# Patient Record
Sex: Female | Born: 1940 | Race: White | Hispanic: No | Smoking: Former smoker
Health system: Southern US, Community
[De-identification: ages and names within clinical notes are randomized; demographics above are authoritative.]

## PROBLEM LIST (undated history)

## (undated) DIAGNOSIS — M545 Low back pain, unspecified: Secondary | ICD-10-CM

## (undated) DIAGNOSIS — E039 Hypothyroidism, unspecified: Secondary | ICD-10-CM

## (undated) DIAGNOSIS — M21379 Foot drop, unspecified foot: Secondary | ICD-10-CM

## (undated) DIAGNOSIS — E785 Hyperlipidemia, unspecified: Secondary | ICD-10-CM

## (undated) DIAGNOSIS — R32 Unspecified urinary incontinence: Secondary | ICD-10-CM

## (undated) DIAGNOSIS — G600 Hereditary motor and sensory neuropathy: Secondary | ICD-10-CM

## (undated) DIAGNOSIS — F329 Major depressive disorder, single episode, unspecified: Secondary | ICD-10-CM

## (undated) DIAGNOSIS — F32A Depression, unspecified: Secondary | ICD-10-CM

## (undated) DIAGNOSIS — K219 Gastro-esophageal reflux disease without esophagitis: Secondary | ICD-10-CM

## (undated) DIAGNOSIS — S92909A Unspecified fracture of unspecified foot, initial encounter for closed fracture: Secondary | ICD-10-CM

## (undated) HISTORY — DX: Major depressive disorder, single episode, unspecified: F32.9

## (undated) HISTORY — DX: Low back pain: M54.5

## (undated) HISTORY — DX: Depression, unspecified: F32.A

## (undated) HISTORY — DX: Low back pain, unspecified: M54.50

## (undated) HISTORY — DX: Unspecified fracture of unspecified foot, initial encounter for closed fracture: S92.909A

## (undated) HISTORY — PX: TONSILLECTOMY: SUR1361

## (undated) HISTORY — DX: Foot drop, unspecified foot: M21.379

## (undated) HISTORY — DX: Hyperlipidemia, unspecified: E78.5

## (undated) HISTORY — PX: TUBAL LIGATION: SHX77

## (undated) HISTORY — DX: Gastro-esophageal reflux disease without esophagitis: K21.9

## (undated) HISTORY — DX: Unspecified urinary incontinence: R32

## (undated) HISTORY — DX: Hypothyroidism, unspecified: E03.9

## (undated) HISTORY — DX: Hereditary motor and sensory neuropathy: G60.0

---

## 2009-03-26 ENCOUNTER — Encounter: Admission: RE | Admit: 2009-03-26 | Discharge: 2009-04-30 | Payer: Self-pay | Admitting: Neurology

## 2013-01-15 ENCOUNTER — Encounter: Payer: Self-pay | Admitting: Neurology

## 2013-01-15 ENCOUNTER — Ambulatory Visit (INDEPENDENT_AMBULATORY_CARE_PROVIDER_SITE_OTHER): Payer: Medicare Other | Admitting: Neurology

## 2013-01-15 ENCOUNTER — Encounter (INDEPENDENT_AMBULATORY_CARE_PROVIDER_SITE_OTHER): Payer: Self-pay

## 2013-01-15 VITALS — BP 120/83 | HR 82 | Wt 192.0 lb

## 2013-01-15 DIAGNOSIS — R269 Unspecified abnormalities of gait and mobility: Secondary | ICD-10-CM | POA: Insufficient documentation

## 2013-01-15 DIAGNOSIS — G600 Hereditary motor and sensory neuropathy: Secondary | ICD-10-CM | POA: Insufficient documentation

## 2013-01-15 MED ORDER — TIZANIDINE HCL 2 MG PO TABS: 2.0000 mg | ORAL_TABLET | Freq: Two times a day (BID) | ORAL | Status: DC

## 2013-01-15 MED ORDER — GABAPENTIN 300 MG PO CAPS: ORAL_CAPSULE | ORAL | Status: DC

## 2013-01-15 NOTE — Patient Instructions (Signed)

## 2013-01-15 NOTE — Progress Notes (Signed)
Reason for visit: Charcot-Marie-Tooth disease  Heather Novak is an 72 y.o. female  History of present illness:  Heather Novak is a 72 year old right-handed white female with a history of Charcot-Marie-Tooth disease with bilateral foot drops. The patient has gotten AFO braces bilaterally, and it has helped her ability to ambulate safely significantly. The patient has not had any falls since last seen. The patient indicates that there has been some issue with cramping of the thighs and the abdomen at times. The patient in the past has been on diazepam in low dose, and this has been helpful. The patient is taking gabapentin without complete control of the neuromuscular discomfort. The patient denies any significant problems with discomfort in the feet, numbness in the feet. The patient returns to the office today for an evaluation.  Past Medical History  Diagnosis Date  . Footdrop     left  . Urinary incontinence   . Low back pain   . Dyslipidemia   . Hypothyroidism   . Depression   . Charcot-Marie-Tooth disease   . GERD (gastroesophageal reflux disease)   . Foot fracture     right    Past Surgical History  Procedure Laterality Date  . Tubal ligation      bilateral  . Tonsillectomy      Family History  Problem Relation Age of Onset  . Stroke Father   . Tremor Father     Social history:  reports that she has quit smoking. Her smoking use included Cigarettes. She smoked 0.00 packs per day. She has never used smokeless tobacco. She reports that she drinks alcohol. She reports that she does not use illicit drugs.   No Known Allergies  Medications:  No current outpatient prescriptions on file prior to visit.   No current facility-administered medications on file prior to visit.    ROS:  Out of a complete 14 system review of symptoms, the patient complains only of the following symptoms, and all other reviewed systems are negative.  Weight gain Swelling in the legs Itching,  moles Urinary frequency and incontinence Feeling cold Muscle cramps, achy muscles Runny nose Decreased energy Restless legs, sleepiness  Blood pressure 120/83, pulse 82, weight 192 lb (87.091 kg).  Physical Exam  General: The patient is alert and cooperative at the time of the examination.  Skin: No significant peripheral edema is noted. The patient is wearing AFO braces bilaterally.   Neurologic Exam  Mental status: The patient is oriented x 3.  Cranial nerves: Facial symmetry is present. Speech is normal, no aphasia or dysarthria is noted. Extraocular movements are full. Visual fields are full.  Motor: The patient has good strength in all 4 extremities, with exception that the patient has bilateral foot drops, and there may be some slight intrinsic muscle weakness of the hands bilaterally.  Sensory examination: Soft touch sensation is symmetric on the face, arms, and legs.  Coordination: The patient has good finger-nose-finger and heel-to-shin bilaterally.  Gait and station: The patient has a slightly wide-based gait. Tandem gait is slightly unsteady.. Romberg is negative. No drift is seen.  Reflexes: Deep tendon reflexes are symmetric, but are depressed at throughout.   Assessment/Plan:  1. Charcot-Marie-Tooth disease  2. Gait instability  The patient indicates that the AFO braces are slightly loose, and she is to go to the orthotics center to have them refitted. The patient is otherwise doing well at this point. The patient will followup in 6-8 months. The patient will be placed  on tizanidine for the muscle cramps. If this is not effective, diazepam can be used.  Heather Palau MD 01/15/2013 8:32 PM  [facility] [address] [place], [place] [address]  Phone [phone] Fax [phone]

## 2013-01-30 ENCOUNTER — Telehealth: Payer: Self-pay | Admitting: Neurology

## 2013-01-30 MED ORDER — DIAZEPAM 2 MG PO TABS: 2.0000 mg | ORAL_TABLET | Freq: Two times a day (BID) | ORAL | Status: DC | PRN

## 2013-01-30 NOTE — Telephone Encounter (Signed)
I called patient. The patient could not tolerate the tizanidine. The patient has has done well with diazepam with the muscle cramps. I will place her on 2 mg twice daily if needed. The patient is not to take the alprazolam if she is on the diazepam.

## 2013-01-31 ENCOUNTER — Telehealth: Payer: Self-pay

## 2013-01-31 NOTE — Telephone Encounter (Signed)
Rx faxed

## 2013-07-18 ENCOUNTER — Ambulatory Visit: Payer: Medicaid Other | Admitting: Neurology

## 2013-07-18 ENCOUNTER — Ambulatory Visit: Payer: Self-pay | Admitting: Adult Health

## 2013-07-22 ENCOUNTER — Ambulatory Visit (INDEPENDENT_AMBULATORY_CARE_PROVIDER_SITE_OTHER): Payer: Medicare Other | Admitting: Adult Health

## 2013-07-22 ENCOUNTER — Encounter: Payer: Self-pay | Admitting: Adult Health

## 2013-07-22 ENCOUNTER — Encounter (INDEPENDENT_AMBULATORY_CARE_PROVIDER_SITE_OTHER): Payer: Self-pay

## 2013-07-22 VITALS — BP 125/83 | HR 58 | Ht 64.5 in | Wt 194.0 lb

## 2013-07-22 DIAGNOSIS — G600 Hereditary motor and sensory neuropathy: Secondary | ICD-10-CM

## 2013-07-22 DIAGNOSIS — R269 Unspecified abnormalities of gait and mobility: Secondary | ICD-10-CM

## 2013-07-22 MED ORDER — DIAZEPAM 2 MG PO TABS: 2.0000 mg | ORAL_TABLET | Freq: Two times a day (BID) | ORAL | Status: DC | PRN

## 2013-07-22 NOTE — Progress Notes (Signed)
I have read the note, and I agree with the clinical assessment and plan.  [place] Bryngelson K Penina Reisner   

## 2013-07-22 NOTE — Patient Instructions (Signed)
Charcot-Marie-Tooth Disorder Charcot-Marie-Tooth (CMT) Disorder is a group of inherited diseases which affect the nerves to the arms and legs. The problems can range from very mild to severe weakness. The feet and legs tend to be affected first. High foot arches and curled toes are often the first signs of this disorder. Because the muscles are not getting the right signals from the brain, walking may become difficult. There may be numbness as well. Fingers and hands may also be involved. Over time, the feet and hands may be deformed.  TREATMENT  There is no cure or specific treatment for CMT. Care may include custom-made shoes and leg braces to reduce discomfort and increase function. Physical therapy and moderate activity are often used to maintain muscle strength. For some patients, surgery may help correct deformities. Pain medicines may be needed. PROGNOSIS CMT is not a fatal disease and most forms of the disorder do not affect normal life expectancy. Most individuals with CMT are able to work. Wheelchair confinement is rare. Document Released: 01/20/2002 Document Revised: 04/24/2011 Document Reviewed: 01/28/2008 [facility] Patient Information 2014 [place]. Fall Prevention and Home Safety Falls cause injuries and can affect all age groups. It is possible to use preventive measures to significantly decrease the likelihood of falls. There are many simple measures which can make your home safer and prevent falls. OUTDOORS  Repair cracks and edges of walkways and driveways.  Remove high doorway thresholds.  Trim shrubbery on the main path into your home.  Have good outside lighting.  Clear walkways of tools, rocks, debris, and clutter.  Check that handrails are not broken and are securely fastened. Both sides of steps should have handrails.  Have leaves, snow, and ice cleared regularly.  Use sand or salt on walkways during winter months.  In the garage, clean up grease or oil  spills. BATHROOM  Install night lights.  Install grab bars by the toilet and in the tub and shower.  Use non-skid mats or decals in the tub or shower.  Place a plastic non-slip stool in the shower to sit on, if needed.  Keep floors dry and clean up all water on the floor immediately.  Remove soap buildup in the tub or shower on a regular basis.  Secure bath mats with non-slip, double-sided rug tape.  Remove throw rugs and tripping hazards from the floors. BEDROOMS  Install night lights.  Make sure a bedside light is easy to reach.  Do not use oversized bedding.  Keep a telephone by your bedside.  Have a firm chair with side arms to use for getting dressed.  Remove throw rugs and tripping hazards from the floor. KITCHEN  Keep handles on pots and pans turned toward the center of the stove. Use back burners when possible.  Clean up spills quickly and allow time for drying.  Avoid walking on wet floors.  Avoid hot utensils and knives.  Position shelves so they are not too high or low.  Place commonly used objects within easy reach.  If necessary, use a sturdy step stool with a grab bar when reaching.  Keep electrical cables out of the way.  Do not use floor polish or wax that makes floors slippery. If you must use wax, use non-skid floor wax.  Remove throw rugs and tripping hazards from the floor. STAIRWAYS  Never leave objects on stairs.  Place handrails on both sides of stairways and use them. Fix any loose handrails. Make sure handrails on both sides of the stairways  are as long as the stairs.  Check carpeting to make sure it is firmly attached along stairs. Make repairs to worn or loose carpet promptly.  Avoid placing throw rugs at the top or bottom of stairways, or properly secure the rug with carpet tape to prevent slippage. Get rid of throw rugs, if possible.  Have an electrician put in a light switch at the top and bottom of the stairs. OTHER FALL  PREVENTION TIPS  Wear low-heel or rubber-soled shoes that are supportive and fit well. Wear closed toe shoes.  When using a stepladder, make sure it is fully opened and both spreaders are firmly locked. Do not climb a closed stepladder.  Add color or contrast paint or tape to grab bars and handrails in your home. Place contrasting color strips on first and last steps.  Learn and use mobility aids as needed. Install an electrical emergency response system.  Turn on lights to avoid dark areas. Replace light bulbs that burn out immediately. Get light switches that glow.  Arrange furniture to create clear pathways. Keep furniture in the same place.  Firmly attach carpet with non-skid or double-sided tape.  Eliminate uneven floor surfaces.  Select a carpet pattern that does not visually hide the edge of steps.  Be aware of all pets. OTHER HOME SAFETY TIPS  Set the water temperature for 120 F (48.8 C).  Keep emergency numbers on or near the telephone.  Keep smoke detectors on every level of the home and near sleeping areas. Document Released: 01/20/2002 Document Revised: 08/01/2011 Document Reviewed: 04/21/2011 [facility] Patient Information 2014 [place].

## 2013-07-22 NOTE — Progress Notes (Signed)
PATIENT: Heather Novak DOB: 1940-07-14  REASON FOR VISIT: follow up HISTORY FROM: patient  HISTORY OF PRESENT ILLNESS: Heather Novak is a 73 year old right-handed white female with a history of Charcot-Marie-Tooth disease with bilateral foot drops. She returns today for evaluation. She currently wears AFO braces and reports that has helped with ambulation. Patient states that she fell two weeks ago and hit her nose. She states that at that time she did not have her braces on. The patient does have some neuromuscular discomfort and she is takes gabapentin for that. Reports that the gabapentin does offer some relief. Patient feels that lately she has been down in the dumps she went back on her bupropion several weeks ago. She also takes valium when she has muscle cramps, at times she will have to take two in order to get rid of her muscle cramps. She is also taking klonopin for anxiety. No new medical issues since last visit.  REVIEW OF SYSTEMS: Full 14 system review of systems performed and notable only for:  Constitutional: fatigue Eyes: eye redness, light sensitivity Ear/Nose/Throat: runny nose  Skin: N/A  Cardiovascular: leg swelling Respiratory: N/A  Gastrointestinal: N/A  Genitourinary: N/A Hematology/Lymphatic: N/A  Endocrine: N/A Musculoskeletal: aching muscles, muscles cramps, walking difficulty Allergy/Immunology: env allergies Neurological: memory loss sleep: restless leg, frequent waking, daytime sleepiness Psychiatric: depression, nervous/anxious   ALLERGIES: Allergies  Allergen Reactions  . Tizanidine     Headache, dizziness    HOME MEDICATIONS: Outpatient Prescriptions Prior to Visit  Medication Sig Dispense Refill  . diazepam (VALIUM) 2 MG tablet Take 1 tablet (2 mg total) by mouth every 12 (twelve) hours as needed for anxiety.  60 tablet  3  . gabapentin (NEURONTIN) 300 MG capsule One capsule twice during the day, two capsules at night      . levothyroxine  (SYNTHROID, LEVOTHROID) 100 MCG tablet Take 100 mcg by mouth daily before breakfast.      . VESICARE 10 MG tablet Take 10 mg by mouth daily.      Marland Kitchen ALPRAZolam (XANAX) 0.5 MG tablet Take 0.5 mg by mouth daily.       No facility-administered medications prior to visit.    PAST MEDICAL HISTORY: Past Medical History  Diagnosis Date  . Footdrop     left  . Urinary incontinence   . Low back pain   . Dyslipidemia   . Hypothyroidism   . Depression   . Charcot-Marie-Tooth disease   . GERD (gastroesophageal reflux disease)   . Foot fracture     right    PAST SURGICAL HISTORY: Past Surgical History  Procedure Laterality Date  . Tubal ligation      bilateral  . Tonsillectomy      FAMILY HISTORY: Family History  Problem Relation Age of Onset  . Stroke Father   . Tremor Father     SOCIAL HISTORY: History   Social History  . Marital Status: Unknown    Spouse Name: N/A    Number of Children: 4  . Years of Education: 11th   Occupational History  . retired    Social History Main Topics  . Smoking status: Former Smoker    Types: Cigarettes  . Smokeless tobacco: Never Used     Comment: quit 1989  . Alcohol Use: Yes     Comment: occasional 3 drinks per week  . Drug Use: No  . Sexual Activity: Not on file   Other Topics Concern  . Not on file  Social History Narrative   Single, with 4 children   11th grade   Right handed   1 cup daily      PHYSICAL EXAM  Filed Vitals:   07/22/13 1308  BP: 125/83  Pulse: 58  Height: 5' 4.5" (1.638 m)  Weight: 194 lb (87.998 kg)   Body mass index is 32.8 kg/(m^2). Generalized: Well developed, in no acute distress   Neurological examination  Mentation: Alert oriented to time, place, history taking. Follows all commands speech and language fluent Cranial nerve II-XII:Extraocular movements were full, visual field were full on confrontational test.  Motor: The motor testing reveals 5 over 5 strength of all 4 extremities  except that she has bilateral foot drops and intrinsic muscle weakness in the hands. Good symmetric motor tone is noted throughout.  Sensory: Sensory testing is intact to soft touch on all 4 extremities. No evidence of extinction is noted.  Coordination: Cerebellar testing reveals good finger-nose-finger and heel-to-shin bilaterally.  Gait and station: Gait is normal. Tandem gait is normal. Romberg is negative. No drift is seen.  Reflexes: Deep tendon reflexes absent.   DIAGNOSTIC DATA (LABS, IMAGING, TESTING) - I reviewed patient records, labs, notes, testing and imaging myself where available.   ASSESSMENT AND PLAN 73 y.o. year old female  has a past medical history of Footdrop; Urinary incontinence; Low back pain; Dyslipidemia; Hypothyroidism; Depression; Charcot-Marie-Tooth disease; GERD (gastroesophageal reflux disease); and Foot fracture. here with:  1. Charcot-Marie-Tooth disease 2. Abnormality of gait  Patient has remained stable. She did a fall two weeks ago but she was not wearing her braces. She would like to go back to physical therapy to get them to look at her AFO braces to see if there is a way to make them more comfortable, however she states insurance will not approve it till July. I advised the patient to call back then and I will refer her to physical therapy. Patient states that the valium is working well especially for muscle spasms, I will refill today. The patient has not been taking her gabapentin at night. If she wants to resume that is ok, she can start by taking 1 tablet at night and increase to 2 tablets if needed. The patient should follow up in 6 months or sooner if needed.   Ward Givens, MSN, NP-C 07/22/2013, 1:14 PM [facility] [address] [place], [place] [address] [phone]  Note: This document was prepared with digital dictation and possible smart phrase technology. Any transcriptional errors that result from this process  are unintentional.

## 2013-12-05 ENCOUNTER — Other Ambulatory Visit: Payer: Self-pay | Admitting: Neurology

## 2013-12-09 ENCOUNTER — Other Ambulatory Visit: Payer: Self-pay

## 2013-12-09 MED ORDER — DIAZEPAM 2 MG PO TABS: 2.0000 mg | ORAL_TABLET | Freq: Two times a day (BID) | ORAL | Status: DC | PRN

## 2013-12-09 NOTE — Telephone Encounter (Signed)
Rx signed and faxed.

## 2013-12-31 ENCOUNTER — Encounter: Payer: Self-pay | Admitting: Neurology

## 2014-01-06 ENCOUNTER — Encounter: Payer: Self-pay | Admitting: Neurology

## 2014-01-22 ENCOUNTER — Ambulatory Visit: Payer: Medicare Other | Admitting: Adult Health

## 2014-01-27 ENCOUNTER — Ambulatory Visit: Payer: Medicare Other | Admitting: Adult Health

## 2014-04-06 ENCOUNTER — Encounter: Payer: Self-pay | Admitting: Adult Health

## 2014-04-06 ENCOUNTER — Ambulatory Visit (INDEPENDENT_AMBULATORY_CARE_PROVIDER_SITE_OTHER): Payer: Medicare Other | Admitting: Adult Health

## 2014-04-06 VITALS — BP 112/78 | HR 79 | Ht 64.0 in | Wt 176.8 lb

## 2014-04-06 DIAGNOSIS — R269 Unspecified abnormalities of gait and mobility: Secondary | ICD-10-CM | POA: Diagnosis not present

## 2014-04-06 DIAGNOSIS — M21372 Foot drop, left foot: Secondary | ICD-10-CM

## 2014-04-06 DIAGNOSIS — M21371 Foot drop, right foot: Secondary | ICD-10-CM | POA: Diagnosis not present

## 2014-04-06 DIAGNOSIS — G600 Hereditary motor and sensory neuropathy: Secondary | ICD-10-CM | POA: Diagnosis not present

## 2014-04-06 MED ORDER — DIAZEPAM 2 MG PO TABS: 2.0000 mg | ORAL_TABLET | Freq: Two times a day (BID) | ORAL | Status: DC | PRN

## 2014-04-06 NOTE — Patient Instructions (Signed)
  Valium was refilled.  Use braces  Wear shoes in the home to prevent falls Use cane at night when you get up to go to the bathroom.

## 2014-04-06 NOTE — Progress Notes (Signed)
PATIENT: Heather Novak DOB: January 15, 1941  REASON FOR VISIT: follow up- CMT HISTORY FROM: patient  HISTORY OF PRESENT ILLNESS: Heather Novak is a 74 year old right-handed white female with a history of Charcot-Marie-Tooth disease with bilateral foot drops. She returns today for evaluation. She is currently taking gabapentin for neuromuscular discomfort. She reports this is working well. Patient will also have muscle cramps and she uses valium- this continues to work well. Patient wears AFO braces. She is getting new tennis shoes to use. She has had about 10 falls since the last visit. They occur mainly at night when she gets up to go to the bathroom and she does not have her braces on.  Patient states that she was has developed a "shakiness" after starting Effexor but that has improved. She state that she has hand weakness and when picking up something heavy it will cause her hand to shake. She also states that she messed her bladder medication up and took 2 pills instead of 1 for 9 days this also made her shaky. Overall no new medical issues.   HISTORY 07/22/13: Heather Novak is a 74 year old right-handed white female with a history of Charcot-Marie-Tooth disease with bilateral foot drops. She returns today for evaluation. She currently wears AFO braces and reports that has helped with ambulation. Patient states that she fell two weeks ago and hit her nose. She states that at that time she did not have her braces on. The patient does have some neuromuscular discomfort and she is takes gabapentin for that. Reports that the gabapentin does offer some relief. Patient feels that lately she has been down in the dumps she went back on her bupropion several weeks ago. She also takes valium when she has muscle cramps, at times she will have to take two in order to get rid of her muscle cramps. She is also taking klonopin for anxiety. No new medical issues since last visit.  REVIEW OF SYSTEMS: Out of a complete 14 system  review of symptoms, the patient complains only of the following symptoms, and all other reviewed systems are negative.  Runny nose, trouble swallowing, restless leg, frequently taking, daytime sleepiness, acting out dreams, muscle cramps, memory loss, tremors, nervous/anxious  ALLERGIES: Allergies  Allergen Reactions  . Tizanidine     Headache, dizziness    HOME MEDICATIONS: Outpatient Prescriptions Prior to Visit  Medication Sig Dispense Refill  . BUPROPION HCL PO Take by mouth.    . ClonazePAM (KLONOPIN PO) Take by mouth.    . diazepam (VALIUM) 2 MG tablet Take 1 tablet (2 mg total) by mouth every 12 (twelve) hours as needed for anxiety. 60 tablet 5  . Esomeprazole Magnesium (NEXIUM PO) Take by mouth.    . gabapentin (NEURONTIN) 300 MG capsule One capsule twice during the day, two capsules at night    . levothyroxine (SYNTHROID, LEVOTHROID) 100 MCG tablet Take 100 mcg by mouth daily before breakfast.    . VESICARE 10 MG tablet Take 10 mg by mouth daily.     No facility-administered medications prior to visit.    PAST MEDICAL HISTORY: Past Medical History  Diagnosis Date  . Footdrop     left  . Urinary incontinence   . Low back pain   . Dyslipidemia   . Hypothyroidism   . Depression   . Charcot-Marie-Tooth disease   . GERD (gastroesophageal reflux disease)   . Foot fracture     right    PAST SURGICAL HISTORY: Past Surgical History  Procedure Laterality Date  . Tubal ligation      bilateral  . Tonsillectomy      FAMILY HISTORY: Family History  Problem Relation Age of Onset  . Stroke Father   . Tremor Father          PHYSICAL EXAM  Filed Vitals:   04/06/14 1107  BP: 112/78  Pulse: 79  Height: 5\' 4"  (1.626 m)  Weight: 176 lb 12.8 oz (80.196 kg)   Body mass index is 30.33 kg/(m^2). Generalized: Well developed, in no acute distress   Neurological examination  Mentation: Alert oriented to time, place, history taking. Follows all commands speech and  language fluent Cranial nerve II-XII: Pupils were equal round reactive to light. Extraocular movements were full, visual field were full on confrontational test. Facial sensation and strength were normal. Uvula tongue midline. Head turning and shoulder shrug  were normal and symmetric. Motor: The motor testing reveals 5 over 5 strength of all 4 extremities. Atrophy of hand muscles bilaterally. Good symmetric motor tone is noted throughout.  Sensory: Sensory testing is intact to soft touch on all 4 extremities. No evidence of extinction is noted.  Coordination: Cerebellar testing reveals good finger-nose-finger and heel-to-shin bilaterally.  Gait and station: Patient does not have her braces on today. She has a steppage type gait. Tandem gait not attempted. Romberg is negative. No drift is seen.  Reflexes: Deep tendon reflexes are symmetric and normal bilaterally.    DIAGNOSTIC DATA (LABS, IMAGING, TESTING) - I reviewed patient records, labs, notes, testing and imaging myself where available.    ASSESSMENT AND PLAN 74 y.o. year old female  has a past medical history of Footdrop; Urinary incontinence; Low back pain; Dyslipidemia; Hypothyroidism; Depression; Charcot-Marie-Tooth disease; GERD (gastroesophageal reflux disease); and Foot fracture. here with:  1. Charcot Marie Tooth disease 2. Bilateral foot drop  Overall the patient is doing well. She has had several falls since the last visit. However the patient was not using her braces when she fell. Encouraged patient to use a cane or walker when ambulating during the night to the bathroom. She verbalized understanding. I also recommended that she always has on shoes/braces when ambulating to prevent falls. If the patient continues to have frequent falls I will refer the patient for physical therapy. She will follow-up in 6 months or sooner if needed.  Ward Givens, MSN, NP-C 04/06/2014, 11:20 AM [facility] [address]  [place], [place] [address] [phone]  Note: This document was prepared with digital dictation and possible smart phrase technology. Any transcriptional errors that result from this process are unintentional.

## 2014-04-06 NOTE — Progress Notes (Signed)
I reviewed note and agree with plan.   Penni Bombard, MD 3/64/6803, 2:12 PM Certified in Neurology, Neurophysiology and Neuroimaging  [facility] [address], [place], [place] [address] [phone]

## 2014-04-06 NOTE — Progress Notes (Signed)
I have read the note, and I agree with the clinical assessment and plan.  Delynda Sepulveda KEITH   

## 2014-10-07 ENCOUNTER — Encounter: Payer: Self-pay | Admitting: Neurology

## 2014-10-07 ENCOUNTER — Ambulatory Visit (INDEPENDENT_AMBULATORY_CARE_PROVIDER_SITE_OTHER): Payer: Medicare Other | Admitting: Neurology

## 2014-10-07 VITALS — BP 112/76 | HR 79 | Ht 64.0 in | Wt 166.0 lb

## 2014-10-07 DIAGNOSIS — G600 Hereditary motor and sensory neuropathy: Secondary | ICD-10-CM | POA: Diagnosis not present

## 2014-10-07 DIAGNOSIS — R269 Unspecified abnormalities of gait and mobility: Secondary | ICD-10-CM

## 2014-10-07 NOTE — Progress Notes (Signed)
Reason for visit:  Charcot-Marie-Tooth disease  Heather Novak is an 74 y.o. female  History of present illness:  Heather Novak is a 74 year old right-handed white female with a history of Charcot-Marie-Tooth disease. The patient has weakness involving the hands and feet, she wears bilateral AFO braces for bilateral foot drops. She has good gait stability using the braces, she denies any falls since last seen. She has some minor discomfort in the feet, primarily associated with hammertoes involving the right foot. The patient does not wish to consider surgery for the feet, however. She denies any significant issues or changes in abilities secondary to her neuropathy since last seen. She returns to this office for an evaluation.  Past Medical History  Diagnosis Date  . Footdrop     left  . Urinary incontinence   . Low back pain   . Dyslipidemia   . Hypothyroidism   . Depression   . Charcot-Marie-Tooth disease   . GERD (gastroesophageal reflux disease)   . Foot fracture     right    Past Surgical History  Procedure Laterality Date  . Tubal ligation      bilateral  . Tonsillectomy      Family History  Problem Relation Age of Onset  . Stroke Father   . Tremor Father     Social history:  reports that she has quit smoking. Her smoking use included Cigarettes. She has never used smokeless tobacco. She reports that she drinks alcohol. She reports that she does not use illicit drugs.    Allergies  Allergen Reactions  . Tizanidine     Headache, dizziness    Medications:  Prior to Admission medications   Medication Sig Start Date End Date Taking? Authorizing Provider  BUPROPION HCL PO Take by mouth.   Yes Historical Provider, MD  ClonazePAM (KLONOPIN PO) Take by mouth.   Yes Historical Provider, MD  diazepam (VALIUM) 2 MG tablet Take 1 tablet (2 mg total) by mouth every 12 (twelve) hours as needed for anxiety. 04/06/14  Yes Ward Givens, NP  Esomeprazole Magnesium (NEXIUM PO)  Take by mouth.   Yes Historical Provider, MD  gabapentin (NEURONTIN) 300 MG capsule One capsule twice during the day, two capsules at night 01/15/13  Yes Kathrynn Ducking, MD  levothyroxine (SYNTHROID, LEVOTHROID) 100 MCG tablet Take 100 mcg by mouth daily before breakfast. 12/17/12  Yes Historical Provider, MD  nystatin cream (MYCOSTATIN)  03/24/14  Yes Historical Provider, MD  TOVIAZ 8 MG TB24 tablet 8 mg daily. 03/23/14  Yes Historical Provider, MD  venlafaxine XR (EFFEXOR-XR) 37.5 MG 24 hr capsule Take 37.5 mg by mouth daily. 04/05/14  Yes Historical Provider, MD  ZETIA 10 MG tablet 10 mg daily. 03/11/14  Yes Historical Provider, MD    ROS:  Out of a complete 14 system review of symptoms, the patient complains only of the following symptoms, and all other reviewed systems are negative.  Runny nose Eye itching, light sensitivity Cold intolerance, heat intolerance Memory loss Muscle cramps Skin rash, itching  Blood pressure 112/76, pulse 79, height 5\' 4"  (1.626 m), weight 166 lb (75.297 kg).  Physical Exam  General: The patient is alert and cooperative at the time of the examination.  Skin: No significant peripheral edema is noted. The patient is wearing bilateral AFO braces.   Neurologic Exam  Mental status: The patient is alert and oriented x 3 at the time of the examination. The patient has apparent normal recent and remote memory, with  an apparently normal attention span and concentration ability.   Cranial nerves: Facial symmetry is present. Speech is normal, no aphasia or dysarthria is noted. Extraocular movements are full. Visual fields are full.  Motor: The patient has good strength in all 4 extremities, with exception of intrinsic muscle weakness bilaterally with the hands, 4/5. The patient also has significant bilateral foot drops.  Sensory examination: Soft touch sensation is symmetric on the face, arms, and legs. There is a stocking pattern pinprick sensory deficit up to  the knees bilaterally.  Coordination: The patient has good finger-nose-finger and heel-to-shin bilaterally.  Gait and station: The patient has a slightly wide-based gait, the patient has remarkable stability with ambulation using the AFO braces. Tandem gait was not tested. Romberg is negative. No drift is seen.  Reflexes: Deep tendon reflexes are symmetric, but are depressed to absent throughout.   Assessment/Plan:  1. Charcot-Marie-Tooth disease  The patient is doing quite well at this time with the AFO braces. She is having minimal discomfort. She does have hammertoes on the right foot, if this is causing significant issues for her, a referral to a podiatrist may be in order. The patient has good stability with ambulation. She will fall on occasion, but it is usually at night when she is trying to go to the bathroom, and she is not wearing her braces. She will follow-up in one year, sooner if needed.  Jill Alexanders MD 10/07/2014 7:13 PM  [facility] [address] [place], [place] [address]  Phone [phone] Fax [phone]

## 2015-02-10 ENCOUNTER — Other Ambulatory Visit: Payer: Self-pay | Admitting: Neurology

## 2015-02-10 MED ORDER — DIAZEPAM 2 MG PO TABS: 2.0000 mg | ORAL_TABLET | Freq: Two times a day (BID) | ORAL | Status: DC | PRN

## 2015-02-10 NOTE — Telephone Encounter (Signed)
Pt needs refill on diazepam (VALIUM) 2 MG tablet. She is also requesting an increase in dosage. Thank you

## 2015-02-10 NOTE — Telephone Encounter (Signed)
I called back and spoke with the patient.  Says she does not need a dose increase at this time.  Verified she is only taking a max of two 2mg  tabs daily if needed.  The Rx in system is for 2mg  tabs one every 12 hours.  Says she will call us back in the future should anything change.   Dr Jannifer Franklin is out of the office.  Request entered, forwarded to [facility] for review.

## 2015-10-11 ENCOUNTER — Ambulatory Visit (INDEPENDENT_AMBULATORY_CARE_PROVIDER_SITE_OTHER): Payer: Medicare Other | Admitting: Neurology

## 2015-10-11 ENCOUNTER — Encounter: Payer: Self-pay | Admitting: Neurology

## 2015-10-11 VITALS — BP 122/84 | HR 68 | Ht 64.0 in | Wt 160.0 lb

## 2015-10-11 DIAGNOSIS — G600 Hereditary motor and sensory neuropathy: Secondary | ICD-10-CM | POA: Diagnosis not present

## 2015-10-11 DIAGNOSIS — R269 Unspecified abnormalities of gait and mobility: Secondary | ICD-10-CM

## 2015-10-11 NOTE — Patient Instructions (Addendum)
Fall Prevention in the Home  Falls can cause injuries and can affect people from all age groups. There are many simple things that you can do to make your home safe and to help prevent falls. WHAT CAN I DO ON THE OUTSIDE OF MY HOME?  Regularly repair the edges of walkways and driveways and fix any cracks.  Remove high doorway thresholds.  Trim any shrubbery on the main path into your home.  Use bright outdoor lighting.  Clear walkways of debris and clutter, including tools and rocks.  Regularly check that handrails are securely fastened and in good repair. Both sides of any steps should have handrails.  Install guardrails along the edges of any raised decks or porches.  Have leaves, snow, and ice cleared regularly.  Use sand or salt on walkways during winter months.  In the garage, clean up any spills right away, including grease or oil spills. WHAT CAN I DO IN THE BATHROOM?  Use night lights.  Install grab bars by the toilet and in the tub and shower. Do not use towel bars as grab bars.  Use non-skid mats or decals on the floor of the tub or shower.  If you need to sit down while you are in the shower, use a plastic, non-slip stool..  Keep the floor dry. Immediately clean up any water that spills on the floor.  Remove soap buildup in the tub or shower on a regular basis.  Attach bath mats securely with double-sided non-slip rug tape.  Remove throw rugs and other tripping hazards from the floor. WHAT CAN I DO IN THE BEDROOM?  Use night lights.  Make sure that a bedside light is easy to reach.  Do not use oversized bedding that drapes onto the floor.  Have a firm chair that has side arms to use for getting dressed.  Remove throw rugs and other tripping hazards from the floor. WHAT CAN I DO IN THE KITCHEN?   Clean up any spills right away.  Avoid walking on wet floors.  Place frequently used items in easy-to-reach places.  If you need to reach for something  above you, use a sturdy step stool that has a grab bar.  Keep electrical cables out of the way.  Do not use floor polish or wax that makes floors slippery. If you have to use wax, make sure that it is non-skid floor wax.  Remove throw rugs and other tripping hazards from the floor. WHAT CAN I DO IN THE STAIRWAYS?  Do not leave any items on the stairs.  Make sure that there are handrails on both sides of the stairs. Fix handrails that are broken or loose. Make sure that handrails are as long as the stairways.  Check any carpeting to make sure that it is firmly attached to the stairs. Fix any carpet that is loose or worn.  Avoid having throw rugs at the top or bottom of stairways, or secure the rugs with carpet tape to prevent them from moving.  Make sure that you have a light switch at the top of the stairs and the bottom of the stairs. If you do not have them, have them installed. WHAT ARE SOME OTHER FALL PREVENTION TIPS?  Wear closed-toe shoes that fit well and support your feet. Wear shoes that have rubber soles or low heels.  When you use a stepladder, make sure that it is completely opened and that the sides are firmly locked. Have someone hold the ladder while you   are using it. Do not climb a closed stepladder.  Add color or contrast paint or tape to grab bars and handrails in your home. Place contrasting color strips on the first and last steps.  Use mobility aids as needed, such as canes, walkers, scooters, and crutches.  Turn on lights if it is dark. Replace any light bulbs that burn out.  Set up furniture so that there are clear paths. Keep the furniture in the same spot.  Fix any uneven floor surfaces.  Choose a carpet design that does not hide the edge of steps of a stairway.  Be aware of any and all pets.  Review your medicines with your healthcare provider. Some medicines can cause dizziness or changes in blood pressure, which increase your risk of falling. Talk  with your health care provider about other ways that you can decrease your risk of falls. This may include working with a physical therapist or trainer to improve your strength, balance, and endurance.   This information is not intended to replace advice given to you by your health care provider. Make sure you discuss any questions you have with your health care provider.   Document Released: 01/20/2002 Document Revised: 06/16/2014 Document Reviewed: 03/06/2014 Elsevier Interactive Patient Education 2016 [employer]  

## 2015-10-11 NOTE — Progress Notes (Signed)
Reason for visit: Charcot-Marie-Tooth disease  Heather Novak is an 75 y.o. female  History of present illness:  Heather Novak is a 75 year old right-handed white female with a history of Charcot-Marie-Tooth disease with bilateral foot drops and some weakness of the hands. The patient is not wearing her AFO braces on a regular basis as she believes that they are too hot in the summertime. Without the braces, she will fall with some regularity. She denies any significant discomfort of the feet or hands. She does have some cramping of the abdominal muscles and of the thigh muscles at times, she will take diazepam for this which is effective. The patient also takes some clonazepam at night if needed for sleep. She indicates that she does not take the clonazepam and the diazepam together. The patient will occasionally drop things from her hands. Overall, no other new medical issues have come up since last seen.  Past Medical History:  Diagnosis Date  . Charcot-Marie-Tooth disease   . Depression   . Dyslipidemia   . Foot fracture    right  . Footdrop    left  . GERD (gastroesophageal reflux disease)   . Hypothyroidism   . Low back pain   . Urinary incontinence     Past Surgical History:  Procedure Laterality Date  . TONSILLECTOMY    . TUBAL LIGATION     bilateral    Family History  Problem Relation Age of Onset  . Stroke Father   . Tremor Father     Social history:  reports that she has quit smoking. Her smoking use included Cigarettes. She has never used smokeless tobacco. She reports that she drinks alcohol. She reports that she does not use drugs.    Allergies  Allergen Reactions  . Tizanidine     Headache, dizziness    Medications:  Prior to Admission medications   Medication Sig Start Date End Date Taking? Authorizing Provider  BUPROPION HCL PO Take by mouth.    Historical Provider, MD  ClonazePAM (KLONOPIN PO) Take by mouth.    Historical Provider, MD  diazepam  (VALIUM) 2 MG tablet Take 1 tablet (2 mg total) by mouth every 12 (twelve) hours as needed. 02/10/15   Penni Bombard, MD  Esomeprazole Magnesium (NEXIUM PO) Take by mouth.    Historical Provider, MD  gabapentin (NEURONTIN) 300 MG capsule One capsule twice during the day, two capsules at night 01/15/13   Kathrynn Ducking, MD  levothyroxine (SYNTHROID, LEVOTHROID) 100 MCG tablet Take 100 mcg by mouth daily before breakfast. 12/17/12   Historical Provider, MD  nystatin cream (MYCOSTATIN)  03/24/14   Historical Provider, MD  TOVIAZ 8 MG TB24 tablet 8 mg daily. 03/23/14   Historical Provider, MD  venlafaxine XR (EFFEXOR-XR) 37.5 MG 24 hr capsule Take 37.5 mg by mouth daily. 04/05/14   Historical Provider, MD  ZETIA 10 MG tablet 10 mg daily. 03/11/14   Historical Provider, MD    ROS:  Out of a complete 14 system review of symptoms, the patient complains only of the following symptoms, and all other reviewed systems are negative.  Weakness Gait instability  Blood pressure 122/84, pulse 68, height 5\' 4"  (1.626 m), weight 160 lb (72.6 kg).  Physical Exam  General: The patient is alert and cooperative at the time of the examination.  Skin: No significant peripheral edema is noted.   Neurologic Exam  Mental status: The patient is alert and oriented x 3 at the time of the  examination. The patient has apparent normal recent and remote memory, with an apparently normal attention span and concentration ability.   Cranial nerves: Facial symmetry is present. Speech is normal, no aphasia or dysarthria is noted. Extraocular movements are full. Visual fields are full.  Motor: The patient has good strength in all 4 extremities, with exception of some weakness of the intrinsic muscles of the hands bilaterally, left greater than right. The patient has prominent foot drops bilaterally with weakness of inversion and eversion of the feet bilaterally.  Sensory examination: Soft touch sensation is symmetric on  the face, arms, and legs.  Coordination: The patient has good finger-nose-finger and heel-to-shin bilaterally.  Gait and station: The patient has a very pronounced bilateral steppage gait pattern. Romberg is unsteady, the patient does not fall. Tandem gait was not attempted.  Reflexes: Deep tendon reflexes are symmetric, but are decreased absent bilaterally.   Assessment/Plan:  1. Charcot-Marie-Tooth disease  2. Gait disorder  I encouraged the patient to wear her AFO braces to improve safety. The patient is on gabapentin and some diazepam through this office, these medications will be continued. She will follow-up in one year, sooner if needed.  Jill Alexanders MD 10/11/2015 11:21 AM  [facility] [address] [place], [place] [address]  Phone [phone] Fax [phone]

## 2015-11-15 ENCOUNTER — Other Ambulatory Visit: Payer: Self-pay

## 2015-11-15 MED ORDER — DIAZEPAM 2 MG PO TABS: 2.0000 mg | ORAL_TABLET | Freq: Two times a day (BID) | ORAL | 0 refills | Status: DC | PRN

## 2015-11-15 NOTE — Telephone Encounter (Signed)
Received faxed pharmacy request for refill on Valium. Rx printed, awaiting signature.

## 2016-05-16 ENCOUNTER — Telehealth: Payer: Self-pay | Admitting: Neurology

## 2016-05-16 DIAGNOSIS — R269 Unspecified abnormalities of gait and mobility: Secondary | ICD-10-CM

## 2016-05-16 NOTE — Telephone Encounter (Signed)
I called patient. I will get the scope therapy set up at [facility].

## 2016-05-16 NOTE — Addendum Note (Signed)
Addended by: Kathrynn Ducking on: 05/16/2016 03:58 PM   Modules accepted: Orders

## 2016-05-16 NOTE — Telephone Encounter (Signed)
Pt called said her gait is getting worse, she would like referral to rehab in [place]. She said Dr Viona Gilmore referred her there about 4 years ago.

## 2016-10-11 ENCOUNTER — Ambulatory Visit: Payer: Medicare Other | Admitting: Neurology

## 2016-10-17 ENCOUNTER — Encounter: Payer: Self-pay | Admitting: Neurology

## 2016-10-17 ENCOUNTER — Ambulatory Visit (INDEPENDENT_AMBULATORY_CARE_PROVIDER_SITE_OTHER): Payer: Medicare Other | Admitting: Neurology

## 2016-10-17 VITALS — BP 107/73 | HR 78 | Ht 64.0 in | Wt 170.5 lb

## 2016-10-17 DIAGNOSIS — R269 Unspecified abnormalities of gait and mobility: Secondary | ICD-10-CM | POA: Diagnosis not present

## 2016-10-17 DIAGNOSIS — G600 Hereditary motor and sensory neuropathy: Secondary | ICD-10-CM | POA: Diagnosis not present

## 2016-10-17 MED ORDER — GABAPENTIN 300 MG PO CAPS: ORAL_CAPSULE | ORAL | 3 refills | Status: DC

## 2016-10-17 MED ORDER — DIAZEPAM 2 MG PO TABS: 2.0000 mg | ORAL_TABLET | Freq: Two times a day (BID) | ORAL | 0 refills | Status: AC | PRN

## 2016-10-17 NOTE — Progress Notes (Signed)
Reason for visit: Charcot-Marie-Tooth disease  Heather Novak is an 76 y.o. female  History of present illness:  Heather Novak is a 76 year old right-handed white female with a history of Charcot-Marie-Tooth disease with a gait disorder associated with bilateral foot drops. The patient has AFO braces but she will not use the braces regularly. She claims that the braces help her balance, she is able to walk without a cane if she has the brace is on. The patient has continued to have occasional falls, she fell yesterday and falls once or twice a month on average. The patient takes gabapentin 300 mg in the morning, she has minimal discomfort in the feet and legs at night, she is sleeping fairly well. She returns for an evaluation.  Past Medical History:  Diagnosis Date  . Charcot-Marie-Tooth disease   . Depression   . Dyslipidemia   . Foot fracture    right  . Footdrop    left  . GERD (gastroesophageal reflux disease)   . Hypothyroidism   . Low back pain   . Urinary incontinence     Past Surgical History:  Procedure Laterality Date  . TONSILLECTOMY    . TUBAL LIGATION     bilateral    Family History  Problem Relation Age of Onset  . Stroke Father   . Tremor Father     Social history:  reports that she has quit smoking. Her smoking use included Cigarettes. She has never used smokeless tobacco. She reports that she drinks alcohol. She reports that she does not use drugs.    Allergies  Allergen Reactions  . Tizanidine     Headache, dizziness    Medications:  Prior to Admission medications   Medication Sig Start Date End Date Taking? Authorizing Provider  BUPROPION HCL PO Take by mouth.   Yes [provider]  ClonazePAM (KLONOPIN PO) Take by mouth.   Yes [provider]  diazepam (VALIUM) 2 MG tablet Take 1 tablet (2 mg total) by mouth every 12 (twelve) hours as needed. 11/15/15  Yes Kathrynn Ducking, MD  Esomeprazole Magnesium (NEXIUM PO) Take by mouth.    Yes [provider]  gabapentin (NEURONTIN) 300 MG capsule One capsule twice during the day, two capsules at night 01/15/13  Yes Kathrynn Ducking, MD  levothyroxine (SYNTHROID, LEVOTHROID) 100 MCG tablet Take 100 mcg by mouth daily before breakfast. 12/17/12  Yes [provider]  nystatin cream (MYCOSTATIN)  03/24/14  Yes [provider]  TOVIAZ 8 MG TB24 tablet 8 mg daily. 03/23/14  Yes [provider]  venlafaxine XR (EFFEXOR-XR) 37.5 MG 24 hr capsule Take 37.5 mg by mouth daily. 04/05/14  Yes [provider]  ZETIA 10 MG tablet 10 mg daily. 03/11/14  Yes [provider]    ROS:  Out of a complete 14 system review of symptoms, the patient complains only of the following symptoms, and all other reviewed systems are negative.  Fatigue Hearing loss Blurred vision Leg swelling Restless legs, frequent waking, daytime sleepiness, acting out dreams Incontinence of the bladder Muscle cramps, walking difficulty Itching Memory loss, weakness Depression  Blood pressure 107/73, pulse 78, height 5\' 4"  (1.626 m), weight 170 lb 8 oz (77.3 kg).  Physical Exam  General: The patient is alert and cooperative at the time of the examination.  Skin: No significant peripheral edema is noted.   Neurologic Exam  Mental status: The patient is alert and oriented x 3 at the time of the  examination. The patient has apparent normal recent and remote memory, with an apparently normal attention span and concentration ability.   Cranial nerves: Facial symmetry is present. Speech is normal, no aphasia or dysarthria is noted. Extraocular movements are full. Visual fields are full.  Motor: The patient has good strength in all 4 extremities, with exception of bilateral prominent foot drops.  Sensory examination: Soft touch sensation is symmetric on the face, arms, and legs.  Coordination: The patient has good finger-nose-finger and heel-to-shin  bilaterally.  Gait and station: The patient has a bilateral steppage gait pattern, she uses a cane for ambulation. Tandem gait was not attempted. Romberg is negative, but is unsteady. No drift is seen.  Reflexes: Deep tendon reflexes are symmetric, but are depressed.   Assessment/Plan:  1. Charcot-Marie-Tooth disease  2. Bilateral foot drops  3. Gait disorder  The patient is encouraged to use her AFO braces which will help her gait stability and safety with walking. She was given a prescription for her gabapentin and for diazepam, she will follow-up in one year.  Jill Alexanders MD 10/17/2016 11:59 AM  [facility] [address] [place], [place] [address]  Phone [phone] Fax [phone]

## 2017-09-05 ENCOUNTER — Other Ambulatory Visit (HOSPITAL_COMMUNITY): Payer: Self-pay | Admitting: Oncology

## 2017-09-05 DIAGNOSIS — C210 Malignant neoplasm of anus, unspecified: Secondary | ICD-10-CM

## 2017-09-05 DIAGNOSIS — C218 Malignant neoplasm of overlapping sites of rectum, anus and anal canal: Secondary | ICD-10-CM

## 2017-09-12 ENCOUNTER — Encounter (HOSPITAL_COMMUNITY)
Admission: RE | Admit: 2017-09-12 | Discharge: 2017-09-12 | Disposition: A | Discharge status: Home / Self Care | Payer: Medicare Other | Source: Ambulatory Visit | Attending: Oncology | Admitting: Oncology

## 2017-09-12 DIAGNOSIS — C210 Malignant neoplasm of anus, unspecified: Secondary | ICD-10-CM | POA: Diagnosis present

## 2017-09-12 DIAGNOSIS — C218 Malignant neoplasm of overlapping sites of rectum, anus and anal canal: Secondary | ICD-10-CM | POA: Diagnosis present

## 2017-09-12 LAB — GLUCOSE, CAPILLARY: Glucose-Capillary: 87 mg/dL (ref 70–99)

## 2017-09-12 MED ORDER — FLUDEOXYGLUCOSE F - 18 (FDG) INJECTION
8.2000 | Freq: Once | INTRAVENOUS | Status: AC | PRN
  Administered 2017-09-12: 8.2 via INTRAVENOUS

## 2017-10-29 ENCOUNTER — Ambulatory Visit: Payer: Medicare Other | Admitting: Adult Health

## 2017-11-12 NOTE — Progress Notes (Deleted)
Psychiatric Initial Adult Assessment   Patient Identification: Heather Novak MRN:  [id number] Date of Evaluation:  11/12/2017 Referral Source: Dr. Ramonita Lab, MD Chief Complaint:   Visit Diagnosis: No diagnosis found.  History of Present Illness:   Heather Novak is a 77 y.o. year old female with a history of depression, Charcot-Marie-Tooth disorder, who was referred for depression.     Associated Signs/Symptoms: Depression Symptoms:  {DEPRESSION SYMPTOMS:20000} (Hypo) Manic Symptoms:  {BHH MANIC SYMPTOMS:22872} Anxiety Symptoms:  {BHH ANXIETY SYMPTOMS:22873} Psychotic Symptoms:  {BHH PSYCHOTIC SYMPTOMS:22874} PTSD Symptoms: {BHH PTSD SYMPTOMS:22875}  Past Psychiatric History:  Outpatient:  Psychiatry admission:  Previous suicide attempt:  Past trials of medication:  History of violence:   Previous Psychotropic Medications: {YES/NO:21197}  Substance Abuse History in the last 12 months:  {yes no:314532}  Consequences of Substance Abuse: {BHH CONSEQUENCES OF SUBSTANCE ABUSE:22880}  Past Medical History:  Past Medical History:  Diagnosis Date  . Charcot-Marie-Tooth disease   . Depression   . Dyslipidemia   . Foot fracture    right  . Footdrop    left  . GERD (gastroesophageal reflux disease)   . Hypothyroidism   . Low back pain   . Urinary incontinence     Past Surgical History:  Procedure Laterality Date  . TONSILLECTOMY    . TUBAL LIGATION     bilateral    Family Psychiatric History: ***  Family History:  Family History  Problem Relation Age of Onset  . Stroke Father   . Tremor Father     Social History:   Social History   Socioeconomic History  . Marital status: Unknown    Spouse name: Not on file  . Number of children: 4  . Years of education: 11th  . Highest education level: Not on file  Occupational History  . Occupation: retired  Scientific laboratory technician  . Financial resource strain: Not on file  . Food insecurity:    Worry: Not on file   Inability: Not on file  . Transportation needs:    Medical: Not on file    Non-medical: Not on file  Tobacco Use  . Smoking status: Former Smoker    Types: Cigarettes  . Smokeless tobacco: Never Used  . Tobacco comment: quit 1989  Substance and Sexual Activity  . Alcohol use: Yes    Comment: occasional 3 drinks per week  . Drug use: No  . Sexual activity: Not on file  Lifestyle  . Physical activity:    Days per week: Not on file    Minutes per session: Not on file  . Stress: Not on file  Relationships  . Social connections:    Talks on phone: Not on file    Gets together: Not on file    Attends religious service: Not on file    Active member of club or organization: Not on file    Attends meetings of clubs or organizations: Not on file    Relationship status: Not on file  Other Topics Concern  . Not on file  Social History Narrative   Single, with 4 children   11th grade   Right handed   1 cup daily    Additional Social History: ***  Allergies:   Allergies  Allergen Reactions  . Tizanidine     Headache, dizziness    Metabolic Disorder Labs: No results found for: HGBA1C, MPG No results found for: PROLACTIN No results found for: CHOL, TRIG, HDL, CHOLHDL, VLDL, LDLCALC   Current Medications: Current Outpatient  Medications  Medication Sig Dispense Refill  . BUPROPION HCL PO Take by mouth.    . ClonazePAM (KLONOPIN PO) Take by mouth.    . diazepam (VALIUM) 2 MG tablet Take 1 tablet (2 mg total) by mouth every 12 (twelve) hours as needed. 60 tablet 0  . Esomeprazole Magnesium (NEXIUM PO) Take by mouth.    . gabapentin (NEURONTIN) 300 MG capsule One capsule daily 90 capsule 3  . levothyroxine (SYNTHROID, LEVOTHROID) 100 MCG tablet Take 100 mcg by mouth daily before breakfast.    . nystatin cream (MYCOSTATIN)     . TOVIAZ 8 MG TB24 tablet 8 mg daily.    Marland Kitchen venlafaxine XR (EFFEXOR-XR) 37.5 MG 24 hr capsule Take 37.5 mg by mouth daily.    Marland Kitchen ZETIA 10 MG tablet 10 mg  daily.     No current facility-administered medications for this visit.     Neurologic: Headache: No Seizure: No Paresthesias:No  Musculoskeletal: Strength & Muscle Tone: within normal limits Gait & Station: normal Patient leans: N/A  Psychiatric Specialty Exam: ROS  There were no vitals taken for this visit.There is no height or weight on file to calculate BMI.  General Appearance: Fairly Groomed  Eye Contact:  Good  Speech:  Clear and Coherent  Volume:  Normal  Mood:  {BHH MOOD:22306}  Affect:  {Affect (PAA):22687}  Thought Process:  Coherent  Orientation:  Full (Time, Place, and Person)  Thought Content:  Logical  Suicidal Thoughts:  {ST/HT (PAA):22692}  Homicidal Thoughts:  {ST/HT (PAA):22692}  Memory:  Immediate;   Good  Judgement:  {Judgement (PAA):22694}  Insight:  {Insight (PAA):22695}  Psychomotor Activity:  Normal  Concentration:  Concentration: Good and Attention Span: Good  Recall:  Good  Fund of Knowledge:Good  Language: Good  Akathisia:  No  Handed:  Right  AIMS (if indicated):  N/A  Assets:  Communication Skills Desire for Improvement  ADL's:  Intact  Cognition: WNL  Sleep:  ***   Assessment  Plan  The patient demonstrates the following risk factors for suicide: Chronic risk factors for suicide include: {Chronic Risk Factors for MWUXLKG:40102725}. Acute risk factors for suicide include: {Acute Risk Factors for DGUYQIH:47425956}. Protective factors for this patient include: {Protective Factors for Suicide LOVF:64332951}. Considering these factors, the overall suicide risk at this point appears to be {Desc; low/moderate/high:110033}. Patient {ACTION; IS/IS OAC:16606301} appropriate for outpatient follow up.   Treatment Plan Summary: Plan as above   Norman Clay, MD 9/30/201910:47 AM

## 2017-11-15 ENCOUNTER — Ambulatory Visit (HOSPITAL_COMMUNITY): Payer: Medicare Other | Admitting: Psychiatry

## 2017-12-06 ENCOUNTER — Other Ambulatory Visit: Payer: Self-pay | Admitting: Neurology

## 2017-12-19 ENCOUNTER — Other Ambulatory Visit (HOSPITAL_COMMUNITY): Payer: Self-pay | Admitting: Radiation Oncology

## 2017-12-19 DIAGNOSIS — C210 Malignant neoplasm of anus, unspecified: Secondary | ICD-10-CM

## 2017-12-19 DIAGNOSIS — C218 Malignant neoplasm of overlapping sites of rectum, anus and anal canal: Secondary | ICD-10-CM

## 2018-01-07 ENCOUNTER — Ambulatory Visit (HOSPITAL_COMMUNITY)
Admission: RE | Admit: 2018-01-07 | Discharge: 2018-01-07 | Disposition: A | Discharge status: Home / Self Care | Payer: Medicare Other | Source: Ambulatory Visit | Attending: Radiation Oncology | Admitting: Radiation Oncology

## 2018-01-07 DIAGNOSIS — C210 Malignant neoplasm of anus, unspecified: Secondary | ICD-10-CM | POA: Insufficient documentation

## 2018-01-07 DIAGNOSIS — C218 Malignant neoplasm of overlapping sites of rectum, anus and anal canal: Secondary | ICD-10-CM | POA: Insufficient documentation

## 2018-01-07 MED ORDER — FLUDEOXYGLUCOSE F - 18 (FDG) INJECTION
10.5200 | Freq: Once | INTRAVENOUS | Status: AC | PRN
  Administered 2018-01-07: 10.52 via INTRAVENOUS

## 2018-05-15 DEATH — deceased

## 2019-10-24 IMAGING — CT NM PET TUM IMG RESTAG (PS) SKULL BASE T - THIGH
3 series · 25 of 25 positions shown · non-contrast
Comparison: 09/12/2017

CLINICAL DATA: Subsequent treatment strategy for rectal cancer.

EXAM:
NUCLEAR MEDICINE PET SKULL BASE TO THIGH
TECHNIQUE: 10.5 mCi F-18 FDG was injected intravenously. Full-ring PET imaging
was performed from the skull base to thigh after the radiotracer. CT
data was obtained and used for attenuation correction and anatomic
localization.
Fasting blood glucose: 90 mg/dl

[Series 3: ct wb fusion · axial · 5.0mm · 0.98mm/px · z∈[-1438,-651]mm · 9 of 316 slices shown]
[im 1/316]
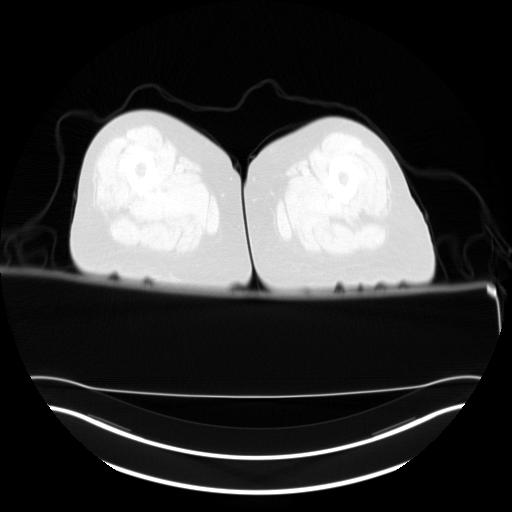
[im 40/316]
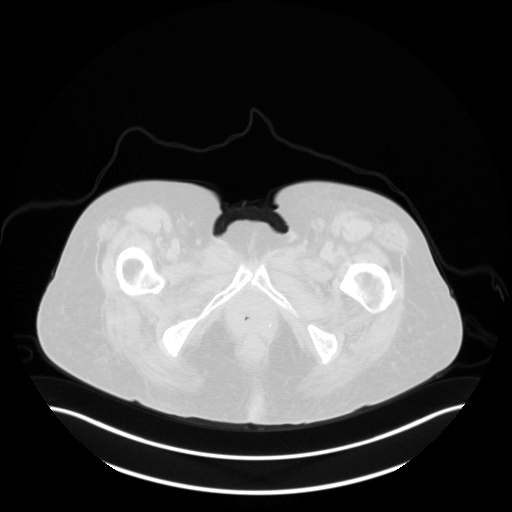
[im 79/316]
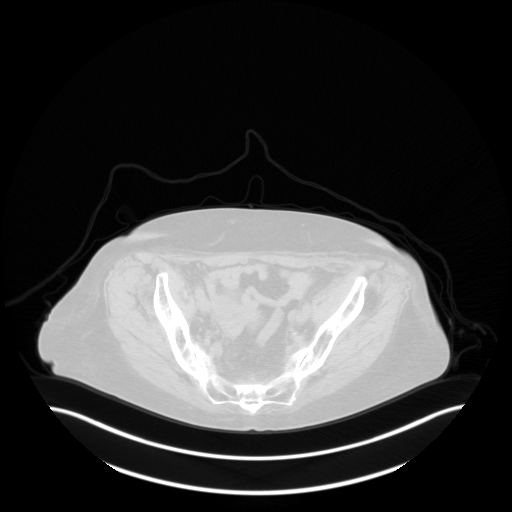
[im 119/316]
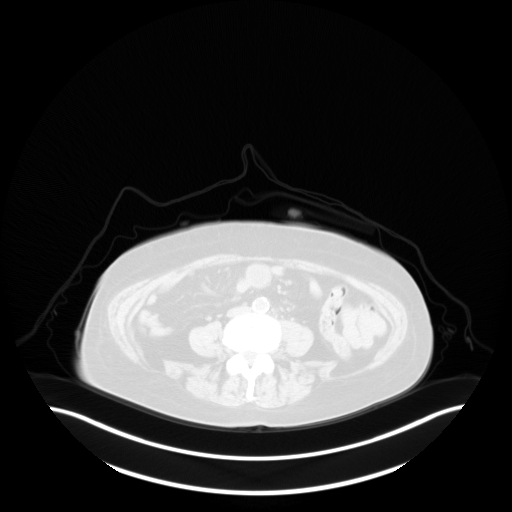
[im 158/316]
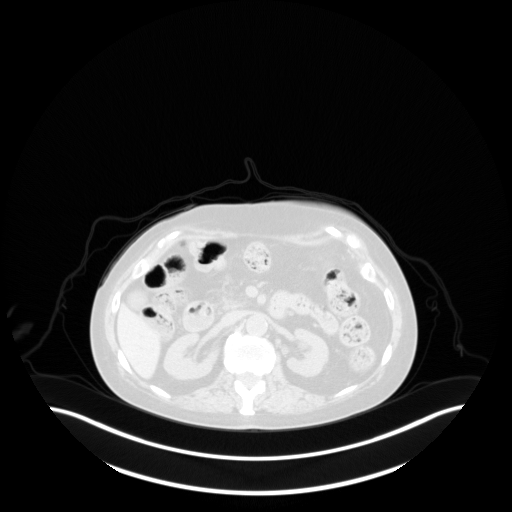
[im 197/316]
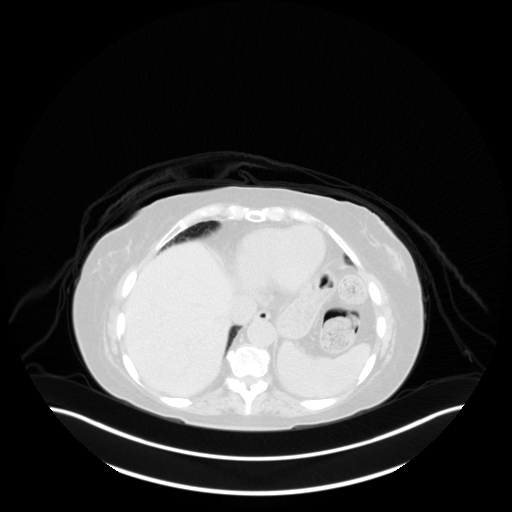
[im 237/316]
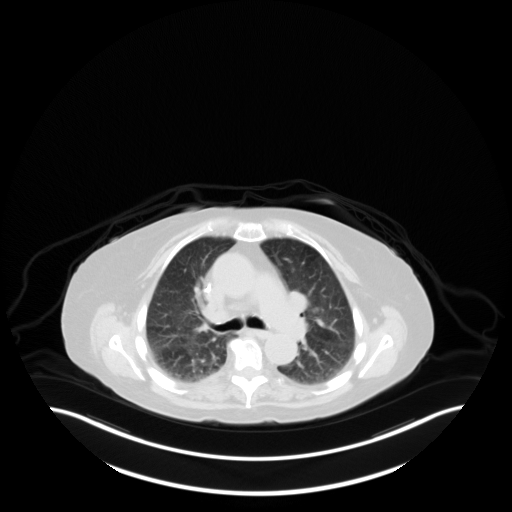
[im 276/316]
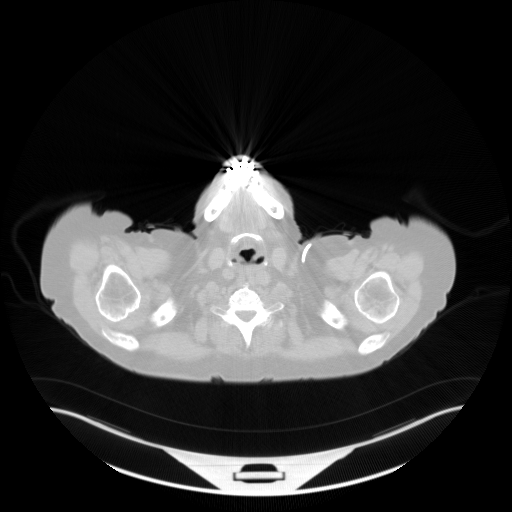
[im 316/316  brain]
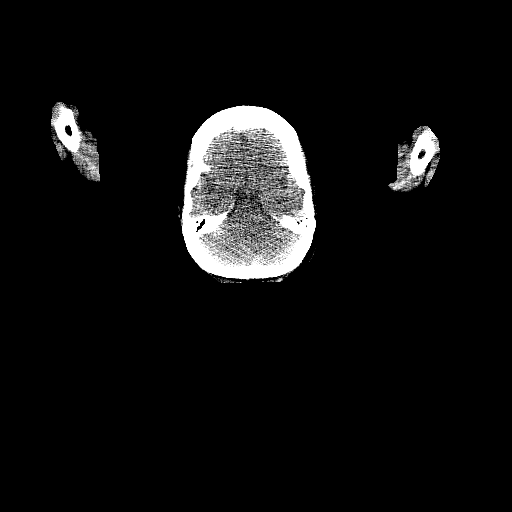

[Series 4: pet wb · axial · 5.0mm · 4.11mm/px · z∈[-1438,-651]mm · 8 of 316 slices shown]
[im 1/316]
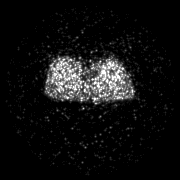
[im 46/316]
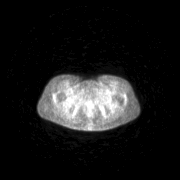
[im 91/316]
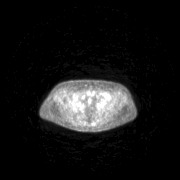
[im 136/316]
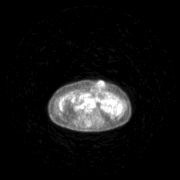
[im 181/316]
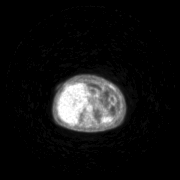
[im 226/316]
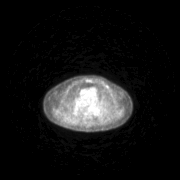
[im 271/316]
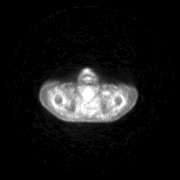
[im 316/316]
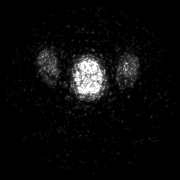

[Series 5: pet wb uncorrected · axial · 5.0mm · 4.11mm/px · z∈[-1438,-651]mm · 8 of 316 slices shown]
[im 1/316]
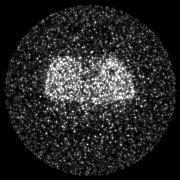
[im 46/316]
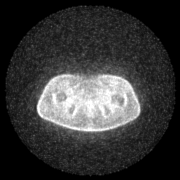
[im 91/316]
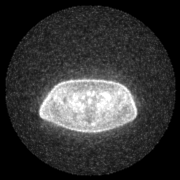
[im 136/316]
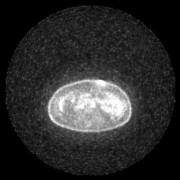
[im 181/316]
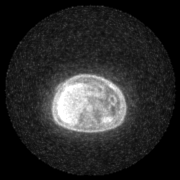
[im 226/316]
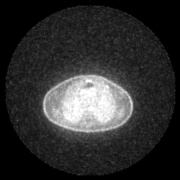
[im 271/316]
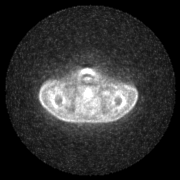
[im 316/316]
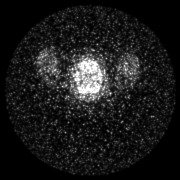

[25 of 25 positions shown; findings below may reference images not displayed]

FINDINGS: Mediastinal blood pool activity: SUV max

NECK: No hypermetabolic lymph nodes in the neck.

Incidental CT findings: none

CHEST: No hypermetabolic axillary, supraclavicular, mediastinal, or
hilar lymph nodes.

Several tiny nodules are identified within the right lung measuring
less than 5 mm and too small to reliably characterize by PET-CT.
This includes a 4 mm subpleural nodule in the right lower lobe and 2
3 mm nodules in the right upper lobe. No hypermetabolic pulmonary
nodules identified.

Incidental CT findings: Aortic atherosclerosis identified
calcification in the LAD coronary artery noted.

ABDOMEN/PELVIS: No abnormal radiotracer uptake within the liver or
pancreas.

There are 2 new foci of increased radiotracer uptake within the
spleen.

-Index lesion measures 1.4 cm within SUV max of 7.14.

Normal appearance of the adrenal glands.

Left retroperitoneal lymph node is identified measuring 1.1 cm
within SUV max of 7.65. New from previous exam.

Incidental CT findings: Aortic atherosclerosis. There is a left
lower quadrant double barrel colostomy. Extensive post treatment
changes are identified within the pelvis with diffuse non
hypermetabolic presacral soft tissue thickening. Cystic lesion
within the right side of pelvis is unchanged compared with previous
exam. This contains a small peripheral mural calcification and may
represent a benign or malignant ovarian neoplasm.

SKELETON: Interval development of multifocal hypermetabolic bone
metastases involving the axial skeleton.

-hypermetabolic lesion involving the C6 spinous process has an SUV
max of 5.72.

-hypermetabolic lesion within T1 has an SUV max of 4.12.

-hypermetabolic T7 vertebra has an SUV max of 4.83.

-hypermetabolic T9 lesion has an SUV max of 8.47.

-hypermetabolic L4 left pedicle lesion has an SUV max of 6.29.

-hypermetabolic right posterior fifth rib lesion has an SUV max of
8.55.

Incidental CT findings: none
IMPRESSION: 1. Interval development of multifocal hypermetabolic bone metastases
involving the cervical, thoracic and lumbar spine. There is also a
hypermetabolic right posterior fifth rib lesion.
2. Two new lesions are identified within the spleen and there is a
hypermetabolic left retroperitoneal lymph node. Findings worrisome
for metastatic disease.
3.  Aortic Atherosclerosis ([id number]).
